# Patient Record
Sex: Male | Born: 1980 | Race: Black or African American | Hispanic: No | Smoking: Current every day smoker
Health system: Southern US, Community
[De-identification: ages and names within clinical notes are randomized; demographics above are authoritative.]

## PROBLEM LIST (undated history)

## (undated) DIAGNOSIS — R011 Cardiac murmur, unspecified: Secondary | ICD-10-CM

---

## 2010-03-14 ENCOUNTER — Emergency Department: Payer: Self-pay | Admitting: Emergency Medicine

## 2015-03-21 ENCOUNTER — Emergency Department
Admission: EM | Admit: 2015-03-21 | Discharge: 2015-03-21 | Disposition: A | Discharge status: Home / Self Care | Payer: Self-pay | Attending: Emergency Medicine | Admitting: Emergency Medicine

## 2015-03-21 ENCOUNTER — Other Ambulatory Visit: Payer: Self-pay | Admitting: Emergency Medicine

## 2015-03-21 ENCOUNTER — Emergency Department: Payer: Self-pay

## 2015-03-21 DIAGNOSIS — F1410 Cocaine abuse, uncomplicated: Secondary | ICD-10-CM | POA: Insufficient documentation

## 2015-03-21 DIAGNOSIS — F10120 Alcohol abuse with intoxication, uncomplicated: Secondary | ICD-10-CM | POA: Insufficient documentation

## 2015-03-21 DIAGNOSIS — Z720 Tobacco use: Secondary | ICD-10-CM | POA: Insufficient documentation

## 2015-03-21 DIAGNOSIS — F10920 Alcohol use, unspecified with intoxication, uncomplicated: Secondary | ICD-10-CM

## 2015-03-21 DIAGNOSIS — R0789 Other chest pain: Secondary | ICD-10-CM | POA: Insufficient documentation

## 2015-03-21 DIAGNOSIS — F1910 Other psychoactive substance abuse, uncomplicated: Secondary | ICD-10-CM

## 2015-03-21 DIAGNOSIS — K922 Gastrointestinal hemorrhage, unspecified: Secondary | ICD-10-CM | POA: Insufficient documentation

## 2015-03-21 LAB — CBC WITH DIFFERENTIAL/PLATELET
Basophils Absolute: 0 10*3/uL (ref 0–0.1)
Basophils Absolute: 0 10*3/uL (ref 0–0.1)
Basophils Relative: 0 %
Basophils Relative: 0 %
EOS PCT: 1 %
EOS PCT: 1 %
Eosinophils Absolute: 0.1 10*3/uL (ref 0–0.7)
Eosinophils Absolute: 0.1 10*3/uL (ref 0–0.7)
HCT: 44.9 % (ref 40.0–52.0)
HEMATOCRIT: 40.3 % (ref 40.0–52.0)
HEMOGLOBIN: 14.9 g/dL (ref 13.0–18.0)
Hemoglobin: 13.3 g/dL (ref 13.0–18.0)
LYMPHS ABS: 1.8 10*3/uL (ref 1.0–3.6)
LYMPHS ABS: 1.9 10*3/uL (ref 1.0–3.6)
LYMPHS PCT: 25 %
LYMPHS PCT: 33 %
MCH: 30.8 pg (ref 26.0–34.0)
MCH: 30.6 pg (ref 26.0–34.0)
MCHC: 33.1 g/dL (ref 32.0–36.0)
MCHC: 33 g/dL (ref 32.0–36.0)
MCV: 93 fL (ref 80.0–100.0)
MCV: 93 fL (ref 80.0–100.0)
MONO ABS: 0.4 10*3/uL (ref 0.2–1.0)
Monocytes Absolute: 0.4 10*3/uL (ref 0.2–1.0)
Monocytes Relative: 6 %
Monocytes Relative: 7 %
NEUTROS ABS: 3.4 10*3/uL (ref 1.4–6.5)
Neutro Abs: 4.8 10*3/uL (ref 1.4–6.5)
Neutrophils Relative %: 68 %
Neutrophils Relative %: 59 %
PLATELETS: 189 10*3/uL (ref 150–440)
PLATELETS: 173 10*3/uL (ref 150–440)
RBC: 4.83 MIL/uL (ref 4.40–5.90)
RBC: 4.34 MIL/uL — AB (ref 4.40–5.90)
RDW: 13.3 % (ref 11.5–14.5)
RDW: 13.1 % (ref 11.5–14.5)
WBC: 7.1 10*3/uL (ref 3.8–10.6)
WBC: 5.9 10*3/uL (ref 3.8–10.6)

## 2015-03-21 LAB — URINE DRUG SCREEN, QUALITATIVE (ARMC ONLY)
AMPHETAMINES, UR SCREEN: NOT DETECTED
Barbiturates, Ur Screen: NOT DETECTED
Benzodiazepine, Ur Scrn: NOT DETECTED
Cannabinoid 50 Ng, Ur ~~LOC~~: NOT DETECTED
Cocaine Metabolite,Ur ~~LOC~~: POSITIVE — AB
MDMA (ECSTASY) UR SCREEN: NOT DETECTED
Methadone Scn, Ur: NOT DETECTED
OPIATE, UR SCREEN: NOT DETECTED
PHENCYCLIDINE (PCP) UR S: NOT DETECTED
Tricyclic, Ur Screen: NOT DETECTED

## 2015-03-21 LAB — COMPREHENSIVE METABOLIC PANEL
ALK PHOS: 66 U/L (ref 38–126)
ALT: 62 U/L (ref 17–63)
AST: 66 U/L — ABNORMAL HIGH (ref 15–41)
Albumin: 4.9 g/dL (ref 3.5–5.0)
Anion gap: 9 (ref 5–15)
BILIRUBIN TOTAL: 0.7 mg/dL (ref 0.3–1.2)
BUN: 9 mg/dL (ref 6–20)
CALCIUM: 8.8 mg/dL — AB (ref 8.9–10.3)
CO2: 26 mmol/L (ref 22–32)
CREATININE: 0.7 mg/dL (ref 0.61–1.24)
Chloride: 100 mmol/L — ABNORMAL LOW (ref 101–111)
Glucose, Bld: 96 mg/dL (ref 65–99)
Potassium: 3.3 mmol/L — ABNORMAL LOW (ref 3.5–5.1)
Sodium: 135 mmol/L (ref 135–145)
TOTAL PROTEIN: 8.2 g/dL — AB (ref 6.5–8.1)

## 2015-03-21 LAB — LIPASE, BLOOD: LIPASE: 36 U/L (ref 22–51)

## 2015-03-21 LAB — TYPE AND SCREEN
ABO/RH(D): O POS
Antibody Screen: NEGATIVE

## 2015-03-21 LAB — TROPONIN I
Troponin I: <0.03 ng/mL (ref <0.031)
Troponin I: <0.03 ng/mL (ref <0.031)

## 2015-03-21 LAB — ETHANOL: Alcohol, Ethyl (B): 289 mg/dL — ABNORMAL HIGH (ref <5)

## 2015-03-21 MED ORDER — SODIUM CHLORIDE 0.9 % IV BOLUS (SEPSIS)
1000.0000 mL | Freq: Once | INTRAVENOUS | Status: AC
  Administered 2015-03-21: 1000 mL via INTRAVENOUS

## 2015-03-21 MED ORDER — HYDROCORTISONE 1 % EX CREA: 1.0000 "application " | TOPICAL_CREAM | Freq: Two times a day (BID) | CUTANEOUS | Status: DC

## 2015-03-21 MED ORDER — LORAZEPAM 2 MG/ML IJ SOLN
1.0000 mg | Freq: Once | INTRAMUSCULAR | Status: AC
  Administered 2015-03-21: 1 mg via INTRAVENOUS
  Filled 2015-03-21: qty 1

## 2015-03-21 MED ORDER — PSYLLIUM 28 % PO PACK: 1.0000 | PACK | Freq: Two times a day (BID) | ORAL | Status: AC | PRN

## 2015-03-21 NOTE — ED Notes (Signed)
Family at bedside. 

## 2015-03-21 NOTE — Discharge Instructions (Signed)
Alcohol Intoxication Alcohol intoxication occurs when the amount of alcohol that a person has consumed impairs his or her ability to mentally and physically function. Alcohol directly impairs the normal chemical activity of the brain. Drinking large amounts of alcohol can lead to changes in mental function and behavior, and it can cause many physical effects that can be harmful.  Alcohol intoxication can range in severity from mild to very severe. Various factors can affect the level of intoxication that occurs, such as the person's age, gender, weight, frequency of alcohol consumption, and the presence of other medical conditions (such as diabetes, seizures, or heart conditions). Dangerous levels of alcohol intoxication may occur when people drink large amounts of alcohol in a short period (binge drinking). Alcohol can also be especially dangerous when combined with certain prescription medicines or "recreational" drugs. SIGNS AND SYMPTOMS Some common signs and symptoms of mild alcohol intoxication include:  Loss of coordination.  Changes in mood and behavior.  Impaired judgment.  Slurred speech. As alcohol intoxication progresses to more severe levels, other signs and symptoms will appear. These may include:  Vomiting.  Confusion and impaired memory.  Slowed breathing.  Seizures.  Loss of consciousness. DIAGNOSIS  Your health care provider will take a medical history and perform a physical exam. You will be asked about the amount and type of alcohol you have consumed. Blood tests will be done to measure the concentration of alcohol in your blood. In many places, your blood alcohol level must be lower than 80 mg/dL (1.61%) to legally drive. However, many dangerous effects of alcohol can occur at much lower levels.  TREATMENT  People with alcohol intoxication often do not require treatment. Most of the effects of alcohol intoxication are temporary, and they go away as the alcohol naturally  leaves the body. Your health care provider will monitor your condition until you are stable enough to go home. Fluids are sometimes given through an IV access tube to help prevent dehydration.  HOME CARE INSTRUCTIONS  Do not drive after drinking alcohol.  Stay hydrated. Drink enough water and fluids to keep your urine clear or pale yellow. Avoid caffeine.   Only take over-the-counter or prescription medicines as directed by your health care provider.  SEEK MEDICAL CARE IF:   You have persistent vomiting.   You do not feel better after a few days.  You have frequent alcohol intoxication. Your health care provider can help determine if you should see a substance use treatment counselor. SEEK IMMEDIATE MEDICAL CARE IF:   You become shaky or tremble when you try to stop drinking.   You shake uncontrollably (seizure).   You throw up (vomit) blood. This may be bright red or may look like black coffee grounds.   You have blood in your stool. This may be bright red or may appear as a black, tarry, bad smelling stool.   You become lightheaded or faint.  MAKE SURE YOU:   Understand these instructions.  Will watch your condition.  Will get help right away if you are not doing well or get worse.   This information is not intended to replace advice given to you by your health care provider. Make sure you discuss any questions you have with your health care provider.   Document Released: 03/10/2005 Document Revised: 01/31/2013 Document Reviewed: 11/03/2012 Elsevier Interactive Patient Education 2016 Else[employer]  Gastrointestinal Bleeding Gastrointestinal bleeding is bleeding somewhere along the path that food travels through the body (digestive tract). This path is  anywhere between the mouth and the opening of the butt (anus). You may have blood in your throw up (vomit) or in your poop (stools). If there is a lot of bleeding, you may need to stay in the hospital. HOME  CARE  Only take medicine as told by your doctor.  Eat foods with fiber such as whole grains, fruits, and vegetables. You can also try eating 1 to 3 prunes a day.  Drink enough fluids to keep your pee (urine) clear or pale yellow. GET HELP RIGHT AWAY IF:   Your bleeding gets worse.  You feel dizzy, weak, or you pass out (faint).  You have bad cramps in your back or belly (abdomen).  You have large blood clumps (clots) in your poop.  Your problems are getting worse. MAKE SURE YOU:   Understand these instructions.  Will watch your condition.  Will get help right away if you are not doing well or get worse.   This information is not intended to replace advice given to you by your health care provider. Make sure you discuss any questions you have with your health care provider.   Document Released: 03/09/2008 Document Revised: 05/17/2012 Document Reviewed: 11/18/2014 Elsevier Interactive Patient Education 2016 [employer].  Polysubstance Abuse When people abuse more than one drug or type of drug it is called polysubstance or polydrug abuse. For example, many smokers also drink alcohol. This is one form of polydrug abuse. Polydrug abuse also refers to the use of a drug to counteract an unpleasant effect produced by another drug. It may also be used to help with withdrawal from another drug. People who take stimulants may become agitated. Sometimes this agitation is countered with a tranquilizer. This helps protect against the unpleasant side effects. Polydrug abuse also refers to the use of different drugs at the same time.  Anytime drug use is interfering with normal living activities, it has become abuse. This includes problems with family and friends. Psychological dependence has developed when your mind tells you that the drug is needed. This is usually followed by physical dependence which has developed when continuing increases of drug are required to get the same feeling or  "high". This is known as addiction or chemical dependency. A person's risk is much higher if there is a history of chemical dependency in the family. SIGNS OF CHEMICAL DEPENDENCY  You have been told by friends or family that drugs have become a problem.  You fight when using drugs.  You are having blackouts (not remembering what you do while using).  You feel sick from using drugs but continue using.  You lie about use or amounts of drugs (chemicals) used.  You need chemicals to get you going.  You are suffering in work performance or in school because of drug use.  You get sick from use of drugs but continue to use anyway.  You need drugs to relate to people or feel comfortable in social situations.  You use drugs to forget problems. "Yes" answered to any of the above signs of chemical dependency indicates there are problems. The longer the use of drugs continues, the greater the problems will become. If there is a family history of drug or alcohol use, it is best not to experiment with these drugs. Continual use leads to tolerance. After tolerance develops more of the drug is needed to get the same feeling. This is followed by addiction. With addiction, drugs become the most important part of life. It becomes more  important to take drugs than participate in the other usual activities of life. This includes relating to friends and family. Addiction is followed by dependency. Dependency is a condition where drugs are now needed not just to get high, but to feel normal. Addiction cannot be cured but it can be stopped. This often requires outside help and the care of professionals. Treatment centers are listed in the yellow pages under: Cocaine, Narcotics, and Alcoholics Anonymous. Most hospitals and clinics can refer you to a specialized care center. Talk to your caregiver if you need help.   This information is not intended to replace advice given to you by your health care provider. Make  sure you discuss any questions you have with your health care provider.   Document Released: 01/20/2005 Document Revised: 08/23/2011 Document Reviewed: 06/05/2014 Elsevier Interactive Patient Education [employer]

## 2015-03-21 NOTE — ED Provider Notes (Signed)
  Physical Exam  BP 105/69 mmHg  Pulse 84  Temp(Src) 97.8 F (36.6 C) (Oral)  Resp 22  Ht  (1.702 m)  Wt 155 lb (70.308 kg)  BMI 24.27 kg/m2  SpO2 97%  Physical Exam  ED Course  Procedures  MDM Care assumed at sign out at 7 am. Patient has cocaine and alcohol abuse here with chest tingling, blood in stool. Was thought to have hemorrhoid bleeding also intoxicated with ETOH 289. Sign out pending reassessment. Patient now more awake and able to converse briefly. Vitals stable. Second trop neg. CBC stable (give 1L NS so Hg dropped a point as expected). Prescribed anusol by previous provider. No further rectal bleeding. Went home with family.   Richardean Canal, MD 03/21/15 [phone]

## 2015-03-21 NOTE — ED Notes (Addendum)
Pt presents to ED via [facility] with c/o BRBPR for >1 month. Pt also reports some numbness and tingling in the left side of his chest, into his left arm and hand. Pt denies any N/V or shortness of breath. Pt reports ETOH, marijuana, and cocaine use frequently, as well as immediately PTA. Pt reports loose stools every time he eats, which is accompanied by the noted BRBPR. Pt is A&O, in NAD, somewhat anxious, with respirations even, regular and unlabored. Pt denies any pain, whatsoever.

## 2015-03-21 NOTE — ED Provider Notes (Signed)
[facility] Emergency Department Provider Note  ____________________________________________  Time seen: Seen upon arrival to the emergency department  I have reviewed the triage vital signs and the nursing notes.   HISTORY  Chief Complaint Hematochezia    HPI Samuel Stuart is a 34 y.o. male with a history of cocaine and alcohol abuse who is presenting today with 1 month of rectal bleeding. He says that his bleeding is bright red and is associated with stooling. He says that he has had increasing amounts of blood over the past 1 month. He says that he amount of bright red stool about an hour prior to arrival. He says that his stools have also been loose and that he has not had to push hard to move his bowels. He denies any antibiotics, travel or recent hospitalization. Said that he also snorted cocaine about 3 hours ago. Is now complaining of intermittent chest tingling to the left chest which radiates to his left arm. Denies any pain, shortness of breath, nausea or vomiting. Also denies any diaphoresis.   History reviewed. No pertinent past medical history.  There are no active problems to display for this patient.   History reviewed. No pertinent past surgical history.  No current outpatient prescriptions on file.  Allergies Review of patient's allergies indicates no known allergies.  History reviewed. No pertinent family history.  Social History Social History  Substance Use Topics  . Smoking status: Current Every Day Smoker  . Smokeless tobacco: None  . Alcohol Use: Yes    Review of Systems Constitutional: No fever/chills Eyes: No visual changes. ENT: No sore throat. Cardiovascular: "Chest tingling" as above. Respiratory: Denies shortness of breath. Gastrointestinal: No abdominal pain.  No nausea, no vomiting.  No constipation. Genitourinary: Negative for dysuria. Musculoskeletal: Negative for back pain. Skin: Negative for  rash. Neurological: Negative for headaches, focal weakness or numbness.  10-point ROS otherwise negative.  ____________________________________________   PHYSICAL EXAM:  VITAL SIGNS: ED Triage Vitals  Enc Vitals Group     BP 03/21/15 0445 136/89 mmHg     Pulse Rate 03/21/15 0445 94     Resp 03/21/15 0445 18     Temp 03/21/15 0445 97.8 F (36.6 C)     Temp Source 03/21/15 0445 Oral     SpO2 03/21/15 0445 98 %     Weight 03/21/15 0445 155 lb (70.308 kg)     Height 03/21/15 0445  (1.702 m)     Head Cir --      Peak Flow --      Pain Score 03/21/15 0451 0     Pain Loc --      Pain Edu? --      Excl. in GC? --     Constitutional: Alert and oriented. Well appearing and in no acute distress. Eyes: Conjunctivae are normal. PERRL. EOMI. Head: Atraumatic. Nose: No congestion/rhinnorhea. Mouth/Throat: Mucous membranes are moist.  Oropharynx non-erythematous. Neck: No stridor.   Cardiovascular: Normal rate, regular rhythm. Grossly normal heart sounds.  Good peripheral circulation. No reproducible tenderness palpation to the chest. Respiratory: Normal respiratory effort.  No retractions. Lungs CTAB. Gastrointestinal: Soft and nontender. No distention. No abdominal bruits. No CVA tenderness. Patient refused digital rectal exam but did allow me to examine externally and has a non-engorged hemorrhoid at 5:00. Hemorrhoid is not tender to palpation. No fissure visualized. Musculoskeletal: No lower extremity tenderness nor edema.  No joint effusions. Neurologic:  Normal speech and language. No gross focal neurologic deficits are  appreciated. No gait instability. Skin:  Skin is warm, dry and intact. No rash noted. Psychiatric: Mood and affect are normal. Speech and behavior are normal.  ____________________________________________   LABS (all labs ordered are listed, but only abnormal results are displayed)  Labs Reviewed  COMPREHENSIVE METABOLIC PANEL - Abnormal; Notable for the  following:    Potassium 3.3 (*)    Chloride 100 (*)    Calcium 8.8 (*)    Total Protein 8.2 (*)    AST 66 (*)    All other components within normal limits  ETHANOL - Abnormal; Notable for the following:    Alcohol, Ethyl (B) 289 (*)    All other components within normal limits  CBC WITH DIFFERENTIAL/PLATELET  LIPASE, BLOOD  TROPONIN I  TYPE AND SCREEN   ____________________________________________  EKG  ED ECG REPORT I, Arelia Longest, the attending physician, personally viewed and interpreted this ECG.   Date: 03/21/2015  EKG Time: 505  Rate: 80  Rhythm: normal sinus rhythm  Axis: Normal axis  Intervals:none  ST&T Change: No ST elevation or depression. No abnormal T-wave inversion.  ____________________________________________  RADIOLOGY  Chest x-ray without any acute cardiopulmonary process. I personally reviewed these films. ____________________________________________   PROCEDURES   ____________________________________________   INITIAL IMPRESSION / ASSESSMENT AND PLAN / ED COURSE  Pertinent labs & imaging results that were available during my care of the patient were reviewed by me and considered in my medical decision making (see chart for details).  ----------------------------------------- 6:55 AM on 03/21/2015 -----------------------------------------  Patient now sedate. Protecting airway and talking but visibly intoxicated. Alcohol level checked and in the 280s. Has not had any bowel movements since being in the emergency department. Highly unlikely that his rectal bleeding is due to any briskly bleeding source. I do suspect it is from the hemorrhoid visualized on the external exam. His labs are also reassuring that he is not losing a large amount of blood to cause him any sort of hemodynamic instability. The patient will need to be reassessed for sobriety. Signed out to Dr. Silverio Lay   ____________________________________________   FINAL CLINICAL  IMPRESSION(S) / ED DIAGNOSES  Acute GI bleed. Acute polysubstance abuse. Acute alcohol intoxication. Acute cocaine chest pain.    Myrna Blazer, MD 03/21/15 [phone]

## 2015-05-15 ENCOUNTER — Emergency Department
Admission: EM | Admit: 2015-05-15 | Discharge: 2015-05-15 | Disposition: A | Discharge status: Home / Self Care | Payer: Self-pay | Attending: Emergency Medicine | Admitting: Emergency Medicine

## 2015-05-15 ENCOUNTER — Encounter: Payer: Self-pay | Admitting: Emergency Medicine

## 2015-05-15 ENCOUNTER — Emergency Department: Payer: Self-pay

## 2015-05-15 DIAGNOSIS — Y9389 Activity, other specified: Secondary | ICD-10-CM | POA: Insufficient documentation

## 2015-05-15 DIAGNOSIS — Z7952 Long term (current) use of systemic steroids: Secondary | ICD-10-CM | POA: Insufficient documentation

## 2015-05-15 DIAGNOSIS — S0990XA Unspecified injury of head, initial encounter: Secondary | ICD-10-CM

## 2015-05-15 DIAGNOSIS — F10129 Alcohol abuse with intoxication, unspecified: Secondary | ICD-10-CM | POA: Insufficient documentation

## 2015-05-15 DIAGNOSIS — S0001XA Abrasion of scalp, initial encounter: Secondary | ICD-10-CM | POA: Insufficient documentation

## 2015-05-15 DIAGNOSIS — F17210 Nicotine dependence, cigarettes, uncomplicated: Secondary | ICD-10-CM | POA: Insufficient documentation

## 2015-05-15 DIAGNOSIS — Y998 Other external cause status: Secondary | ICD-10-CM | POA: Insufficient documentation

## 2015-05-15 DIAGNOSIS — Y9289 Other specified places as the place of occurrence of the external cause: Secondary | ICD-10-CM | POA: Insufficient documentation

## 2015-05-15 DIAGNOSIS — S0003XA Contusion of scalp, initial encounter: Secondary | ICD-10-CM | POA: Insufficient documentation

## 2015-05-15 DIAGNOSIS — W228XXA Striking against or struck by other objects, initial encounter: Secondary | ICD-10-CM | POA: Insufficient documentation

## 2015-05-15 HISTORY — DX: Cardiac murmur, unspecified: R01.1

## 2015-05-15 NOTE — ED Notes (Addendum)
Pt in police custody, [employer] at bedside
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
## 2015-05-15 NOTE — ED Notes (Signed)
Pt given ice pack upon discharge. 
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
## 2015-05-15 NOTE — Discharge Instructions (Signed)
Head Injury, Adult °You have a head injury. Headaches and throwing up (vomiting) are common after a head injury. It should be easy to wake up from sleeping. Sometimes you must stay in the hospital. Most problems happen within the first 24 hours. Side effects may occur up to 7-10 days after the injury.  °WHAT ARE THE TYPES OF HEAD INJURIES? °Head injuries can be as minor as a bump. Some head injuries can be more severe. More severe head injuries include: °· A jarring injury to the brain (concussion). °· A bruise of the brain (contusion). This mean there is bleeding in the brain that can cause swelling. °· A cracked skull (skull fracture). °· Bleeding in the brain that collects, clots, and forms a bump (hematoma). °WHEN SHOULD I GET HELP RIGHT AWAY?  °· You are confused or sleepy. °· You cannot be woken up. °· You feel sick to your stomach (nauseous) or keep throwing up (vomiting). °· Your dizziness or unsteadiness is getting worse. °· You have very bad, lasting headaches that are not helped by medicine. Take medicines only as told by your doctor. °· You cannot use your arms or legs like normal. °· You cannot walk. °· You notice changes in the black spots in the center of the colored part of your eye (pupil). °· You have clear or bloody fluid coming from your nose or ears. °· You have trouble seeing. °During the next 24 hours after the injury, you must stay with someone who can watch you. This person should get help right away (call 911 in the [place] if you start to shake and are not able to control it (have seizures), you pass out, or you are unable to wake up. °HOW CAN I PREVENT A HEAD INJURY IN THE FUTURE? °· Wear seat belts. °· Wear a helmet while bike riding and playing sports like football. °· Stay away from dangerous activities around the house. °WHEN CAN I RETURN TO NORMAL ACTIVITIES AND ATHLETICS? °See your doctor before doing these activities. You should not do normal activities or play contact sports until 1  week after the following symptoms have stopped: °· Headache that does not go away. °· Dizziness. °· Poor attention. °· Confusion. °· Memory problems. °· Sickness to your stomach or throwing up. °· Tiredness. °· Fussiness. °· Bothered by bright lights or loud noises. °· Anxiousness or depression. °· Restless sleep. °MAKE SURE YOU:  °· Understand these instructions. °· Will watch your condition. °· Will get help right away if you are not doing well or get worse. °  °This information is not intended to replace advice given to you by your health care provider. Make sure you discuss any questions you have with your health care provider. °  °Document Released: 05/13/2008 Document Revised: 06/21/2014 Document Reviewed: 02/05/2013 °Elsevier Interactive Patient Education ©2016 [employer] ° °

## 2015-11-04 ENCOUNTER — Emergency Department: Payer: Self-pay

## 2015-11-04 ENCOUNTER — Emergency Department
Admission: EM | Admit: 2015-11-04 | Discharge: 2015-11-04 | Disposition: A | Discharge status: Home / Self Care | Payer: Self-pay | Attending: Emergency Medicine | Admitting: Emergency Medicine

## 2015-11-04 ENCOUNTER — Encounter: Payer: Self-pay | Admitting: Emergency Medicine

## 2015-11-04 DIAGNOSIS — S0990XA Unspecified injury of head, initial encounter: Secondary | ICD-10-CM

## 2015-11-04 DIAGNOSIS — F1290 Cannabis use, unspecified, uncomplicated: Secondary | ICD-10-CM | POA: Insufficient documentation

## 2015-11-04 DIAGNOSIS — F17210 Nicotine dependence, cigarettes, uncomplicated: Secondary | ICD-10-CM | POA: Insufficient documentation

## 2015-11-04 DIAGNOSIS — S0101XA Laceration without foreign body of scalp, initial encounter: Secondary | ICD-10-CM | POA: Insufficient documentation

## 2015-11-04 DIAGNOSIS — Y939 Activity, unspecified: Secondary | ICD-10-CM | POA: Insufficient documentation

## 2015-11-04 DIAGNOSIS — Y92009 Unspecified place in unspecified non-institutional (private) residence as the place of occurrence of the external cause: Secondary | ICD-10-CM | POA: Insufficient documentation

## 2015-11-04 DIAGNOSIS — Y999 Unspecified external cause status: Secondary | ICD-10-CM | POA: Insufficient documentation

## 2015-11-04 DIAGNOSIS — W19XXXA Unspecified fall, initial encounter: Secondary | ICD-10-CM | POA: Insufficient documentation

## 2015-11-04 MED ORDER — AMOXICILLIN-POT CLAVULANATE 875-125 MG PO TABS: 1.0000 | ORAL_TABLET | Freq: Two times a day (BID) | ORAL | Status: AC

## 2015-11-04 NOTE — Discharge Instructions (Signed)
Nonsutured Laceration Care °A laceration is a cut that goes through all layers of the skin and extends into the tissue that is right under the skin. This type of cut is usually stitched up (sutured) or closed with tape (adhesive strips) or skin glue shortly after the injury happens. °However, if the wound is dirty or if several hours pass before medical treatment is provided, it is likely that germs (bacteria) will enter the wound. Closing a laceration after bacteria have entered it increases the risk of infection. In these cases, your health care provider may leave the laceration open (nonsutured) and cover it with a bandage. This type of treatment helps prevent infection and allows the wound to heal from the deepest layer of tissue damage up to the surface. °An open fracture is a type of injury that may involve nonsutured lacerations. An open fracture is a break in a bone that happens along with one or more lacerations through the skin that is near the fracture site. °HOW TO CARE FOR YOUR NONSUTURED LACERATION °· Take or apply over-the-counter and prescription medicines only as told by your health care provider. °· If you were prescribed an antibiotic medicine, take or apply it as told by your health care provider. Do not stop using the antibiotic even if your condition improves. °· Clean the wound one time each day or as told by your health care provider. °¨ Wash the wound with mild soap and water. °¨ Rinse the wound with water to remove all soap. °¨ Pat your wound dry with a clean towel. Do not rub the wound. °· Do not inject anything into the wound unless your health care provider told you to. °· Change any bandages (dressings) as told by your health care provider. This includes changing the dressing if it gets wet, dirty, or starts to smell bad. °· Keep the dressing dry until your health care provider says it can be removed. Do not take baths, swim, or do anything that puts your wound underwater until your  health care provider approves. °· Raise (elevate) the injured area above the level of your heart while you are sitting or lying down, if possible. °· Do not scratch or pick at the wound. °· Check your wound every day for signs of infection. Watch for: °¨ Redness, swelling, or pain. °¨ Fluid, blood, or pus. °· Keep all follow-up visits as told by your health care provider. This is important. °SEEK MEDICAL CARE IF: °· You received a tetanus and shot and you have swelling, severe pain, redness, or bleeding at the injection site.   °· You have a fever. °· Your pain is not controlled with medicine. °· You have increased redness, swelling, or pain at the site of your wound. °· You have fluid, blood, or pus coming from your wound. °· You notice a bad smell coming from your wound or your dressing. °· You notice something coming out of the wound, such as wood or glass. °· You notice a change in the color of your skin near your wound. °· You develop a new rash. °· You need to change the dressing frequently due to fluid, blood, or pus draining from the wound. °· You develop numbness around your wound. °SEEK IMMEDIATE MEDICAL CARE IF: °· Your pain suddenly increases and is severe. °· You develop severe swelling around the wound. °· The wound is on your hand or foot and you cannot properly move a finger or toe. °· The wound is on your hand or   foot and you notice that your fingers or toes look pale or bluish. °· You have a red streak going away from your wound. °  °This information is not intended to replace advice given to you by your health care provider. Make sure you discuss any questions you have with your health care provider. °  °Document Released: 04/28/2006 Document Revised: 10/15/2014 Document Reviewed: 05/27/2014 °Elsevier Interactive Patient Education ©2016 [employer] ° °
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
## 2015-11-04 NOTE — ED Notes (Signed)
Pt discharged home after verbalizing understanding of discharge instructions; nad noted. 
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
## 2016-10-11 ENCOUNTER — Emergency Department
Admission: EM | Admit: 2016-10-11 | Discharge: 2016-10-11 | Disposition: A | Discharge status: Home / Self Care | Payer: Self-pay

## 2016-10-11 NOTE — ED Triage Notes (Signed)
Animal bite to finger a few days ago. States "I just want to make sure it's not getting infected". Patient states that he does not know dog that bit him.

## 2016-10-12 ENCOUNTER — Telehealth: Payer: Self-pay | Admitting: Emergency Medicine

## 2016-10-12 NOTE — Telephone Encounter (Signed)
Called patient due to lwot to inquire about condition and follow up plans.  Number is not in service.  
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
## 2018-08-27 ENCOUNTER — Other Ambulatory Visit: Payer: Self-pay

## 2018-08-27 ENCOUNTER — Emergency Department
Admission: EM | Admit: 2018-08-27 | Discharge: 2018-08-27 | Disposition: A | Discharge status: Home / Self Care | Payer: Self-pay | Attending: Emergency Medicine | Admitting: Emergency Medicine

## 2018-08-27 ENCOUNTER — Encounter: Payer: Self-pay | Admitting: Emergency Medicine

## 2018-08-27 DIAGNOSIS — F17210 Nicotine dependence, cigarettes, uncomplicated: Secondary | ICD-10-CM | POA: Insufficient documentation

## 2018-08-27 DIAGNOSIS — Y908 Blood alcohol level of 240 mg/100 ml or more: Secondary | ICD-10-CM | POA: Insufficient documentation

## 2018-08-27 DIAGNOSIS — F1010 Alcohol abuse, uncomplicated: Secondary | ICD-10-CM | POA: Insufficient documentation

## 2018-08-27 LAB — URINE DRUG SCREEN, QUALITATIVE (ARMC ONLY)
Amphetamines, Ur Screen: NOT DETECTED
BENZODIAZEPINE, UR SCRN: NOT DETECTED
Barbiturates, Ur Screen: NOT DETECTED
Cannabinoid 50 Ng, Ur ~~LOC~~: POSITIVE — AB
Cocaine Metabolite,Ur ~~LOC~~: NOT DETECTED
MDMA (Ecstasy)Ur Screen: NOT DETECTED
Methadone Scn, Ur: NOT DETECTED
OPIATE, UR SCREEN: NOT DETECTED
Phencyclidine (PCP) Ur S: NOT DETECTED
Tricyclic, Ur Screen: NOT DETECTED

## 2018-08-27 LAB — COMPREHENSIVE METABOLIC PANEL
ALT: 34 U/L (ref 0–44)
AST: 51 U/L — ABNORMAL HIGH (ref 15–41)
Albumin: 4.7 g/dL (ref 3.5–5.0)
Alkaline Phosphatase: 74 U/L (ref 38–126)
Anion gap: 11 (ref 5–15)
BUN: 11 mg/dL (ref 6–20)
CO2: 25 mmol/L (ref 22–32)
Calcium: 8.8 mg/dL — ABNORMAL LOW (ref 8.9–10.3)
Chloride: 103 mmol/L (ref 98–111)
Creatinine, Ser: 0.73 mg/dL (ref 0.61–1.24)
GFR calc Af Amer: >60 mL/min (ref >60)
GFR calc non Af Amer: >60 mL/min (ref >60)
GLUCOSE: 102 mg/dL — AB (ref 70–99)
Potassium: 4 mmol/L (ref 3.5–5.1)
Sodium: 139 mmol/L (ref 135–145)
Total Bilirubin: 0.5 mg/dL (ref 0.3–1.2)
Total Protein: 8 g/dL (ref 6.5–8.1)

## 2018-08-27 LAB — ACETAMINOPHEN LEVEL: Acetaminophen (Tylenol), Serum: <10 ug/mL — ABNORMAL LOW (ref 10–30)

## 2018-08-27 LAB — CBC
HEMATOCRIT: 45.5 % (ref 39.0–52.0)
Hemoglobin: 15.4 g/dL (ref 13.0–17.0)
MCH: 30.5 pg (ref 26.0–34.0)
MCHC: 33.8 g/dL (ref 30.0–36.0)
MCV: 90.1 fL (ref 80.0–100.0)
Platelets: 239 10*3/uL (ref 150–400)
RBC: 5.05 MIL/uL (ref 4.22–5.81)
RDW: 13.9 % (ref 11.5–15.5)
WBC: 6.2 10*3/uL (ref 4.0–10.5)
nRBC: 0 % (ref 0.0–0.2)

## 2018-08-27 LAB — ETHANOL: ALCOHOL ETHYL (B): 285 mg/dL — AB (ref <10)

## 2018-08-27 LAB — SALICYLATE LEVEL: Salicylate Lvl: <7 mg/dL (ref 2.8–30.0)

## 2018-08-27 LAB — FOLATE: Folate: 7.7 ng/mL (ref >5.9)

## 2018-08-27 MED ORDER — VITAMIN B-1 100 MG PO TABS: 100.0000 mg | ORAL_TABLET | Freq: Once | ORAL | Status: DC

## 2018-08-27 NOTE — ED Notes (Signed)
SOC called report given, SOC machine moved into patients room.  Pt. Woke and ready.

## 2018-08-27 NOTE — Discharge Instructions (Addendum)

## 2018-08-27 NOTE — ED Notes (Signed)
Explained to pt the IVC process and drawing blood/getting urine specimen; pt has been calm and cooperative until right before drawing blood; pt became agitated, said "you ain't drawing no blood"; very defensive; pt to bathroom to give urine specimen but unable to draw blood in triage

## 2018-08-27 NOTE — ED Notes (Signed)
Patient resting quietly in room. No noted distress or abnormal behaviors noted. Will continue 15 minute checks and observation by security camera for safety. 

## 2018-08-27 NOTE — ED Notes (Signed)
Gave pt tooth brush, deodorant, and washed up. Pt is waiting on ride to pick him up.

## 2018-08-27 NOTE — ED Provider Notes (Signed)
[facility] Emergency Department Provider Note   ____________________________________________   First MD Initiated Contact with Patient 08/27/18 0215     (approximate)  I have reviewed the triage vital signs and the nursing notes.   HISTORY  Chief Complaint Mental Health Problem    HPI Samuel Stuart is a 38 y.o. male here for evaluation of "involuntary commitment"  Patient reports that he and his girlfriend were both drinking, he reports she was drinking pretty heavily tonight and called the police by his girlfriend when he might of said some about wanting to hurt himself but reports that he said it because he was drunk.  He reports that they (police) came in he came with them because he knew that he had to.  He reports he denies that he has any thoughts of hurting himself now and made a dumb statement.  He reports he has a steady job for 2 years, works 10-hour shifts daily, drinks beer in the evening each night.  Denies going into withdrawals, but does report that sometimes at the end of his shift she will start to feel a little bit shaky in his hands and gets better after he drinks.  He drinks 2, 40 ounce beers most nights,  same this evening.  Denies overdosing on anything including medications or bleach.  Denies any hallucinations.  Denies desire to harm himself or else.  He denies making any statements or demonstrate any was drinking "bleach.  Reports he would have done anything like this.  Past Medical History:  Diagnosis Date   Heart murmur     There are no active problems to display for this patient.   History reviewed. No pertinent surgical history.  Prior to Admission medications   Not on File    Allergies Patient has no known allergies.  History reviewed. No pertinent family history.  Social History Social History   Tobacco Use   Smoking status: Current Every Day Smoker    Packs/day: 0.50    Types: Cigarettes   Smokeless  tobacco: Never Used  Substance Use Topics   Alcohol use: Yes   Drug use: Yes    Types: Marijuana    Comment: last smoked about 1 hour ago    Review of Systems Constitutional: No fever/chills reports he has been healthy working his regular 10-hour drive each day. Eyes: No visual changes. ENT: No sore throat. Cardiovascular: Denies chest pain. Respiratory: Denies shortness of breath. Gastrointestinal: No abdominal pain.   Skeletal: Sometimes gets a little shakes in the hands at the ends of his work shift Skin: Negative for rash. Neurological: Negative for headaches, areas of focal weakness or numbness.    ____________________________________________   PHYSICAL EXAM:  VITAL SIGNS: ED Triage Vitals  Enc Vitals Group     BP 08/27/18 0111 (!) 141/95     Pulse Rate 08/27/18 0111 89     Resp 08/27/18 0111 17     Temp 08/27/18 0111 98 F (36.7 C)     Temp Source 08/27/18 0111 Oral     SpO2 08/27/18 0111 99 %     Weight 08/27/18 0112 150 lb (68 kg)     Height 08/27/18 0112 5\' 7"  (1.702 m)     Head Circumference --      Peak Flow --      Pain Score 08/27/18 0112 0     Pain Loc --      Pain Edu? --      Excl. in GC? --  Constitutional: Alert and oriented. Well appearing and in no acute distress.  He is ambulatory, pleasant.  Conversant.  In no distress.  Does not apparent that he has any evidence of clinical intoxication by exam. Eyes: Conjunctivae are normal. Head: Atraumatic. Nose: No congestion/rhinnorhea. Mouth/Throat: Mucous membranes are moist. Neck: No stridor.  Cardiovascular: Normal rate, regular rhythm. Grossly normal heart sounds.  Good peripheral circulation. Respiratory: Normal respiratory effort.  No retractions. Lungs CTAB. Gastrointestinal: Soft and nontender. No distention. Musculoskeletal: No lower extremity tenderness nor edema.  He is walks with normal gait.  His speech is clear. Neurologic:  Normal speech and language. No gross focal neurologic  deficits are appreciated.  Skin:  Skin is warm, dry and intact. No rash noted. Psychiatric: Mood and affect are normal. Speech and behavior are normal.  ____________________________________________   LABS (all labs ordered are listed, but only abnormal results are displayed)  Labs Reviewed  COMPREHENSIVE METABOLIC PANEL - Abnormal; Notable for the following components:      Result Value   Glucose, Bld 102 (*)    Calcium 8.8 (*)    AST 51 (*)    All other components within normal limits  ETHANOL - Abnormal; Notable for the following components:   Alcohol, Ethyl (B) 285 (*)    All other components within normal limits  ACETAMINOPHEN LEVEL - Abnormal; Notable for the following components:   Acetaminophen (Tylenol), Serum <10 (*)    All other components within normal limits  URINE DRUG SCREEN, QUALITATIVE ([facility] ONLY) - Abnormal; Notable for the following components:   Cannabinoid 50 Ng, Ur [place] POSITIVE (*)    All other components within normal limits  SALICYLATE LEVEL  CBC  FOLATE   ____________________________________________  EKG   ____________________________________________  RADIOLOGY    ____________________________________________   PROCEDURES  Procedure(s) performed: None  Procedures  Critical Care performed: No  ____________________________________________   INITIAL IMPRESSION / ASSESSMENT AND PLAN / ED COURSE  Pertinent labs & imaging results that were available during my care of the patient were reviewed by me and considered in my medical decision making (see chart for details).   History does not appear to be compatible with self-injurious behavior or overdose.  He has a normal anion gap.  Reports to me he only drinks store-bought beer and 40 ounce cans and sometimes 24 ounces.  His blood alcohol does corroborate his intoxication, but clinically he is quite alert oriented without evidence of obvious intoxication likely because he is a regular heavy  drinker.  Liver pattern consistent with alcohol disease.  Patient currently denying any desire to harm himself or anyone else, would like to be seen by psychiatrist to be able to go home.  I have placed a tele-psych consult and continued his IVC which she came with until he can be evaluated by psychiatry for their recommendation.    ----------------------------------------- 4:15 AM on 08/27/2018 -----------------------------------------  Patient has been seen and cleared for discharge to home with outpatient referral by psychiatry.  Patient does not meet criteria for IVC per psychiatry and IVC has been rescinded.  The patient is currently resting comfortably, he is released from IVC, but at this point he prefers to rest before discharge and I think this is reasonable given his intoxication by blood alcohol testing.  We will await a little bit earlier in the morning and anticipate discharge once he is up and about.  ----------------------------------------- 7:23 AM on 08/27/2018 -----------------------------------------  Patient is fully alert, no distress.  Agreeable with plan  for discharge.  Ambulating in no distress.  Police planning to bring the patient back to his home.  Return precautions and treatment recommendations and follow-up discussed with the patient who is agreeable with the plan.  ____________________________________________   FINAL CLINICAL IMPRESSION(S) / ED DIAGNOSES  Final diagnoses:  Alcohol abuse        Note:  This document was prepared using Dragon voice recognition software and may include unintentional dictation errors       Sharyn Creamer, MD 08/27/18 [phone]

## 2018-08-27 NOTE — ED Triage Notes (Signed)
Pt brought to ED by [employer] officer Delford Field with IVC papers; pt says his "boo boo is worried about me"; pt calm and cooperative at this time; pt admits to drinking alcohol and says he did say he wanted to hurt himself; "but I didn't";

## 2018-08-27 NOTE — ED Notes (Signed)
After officer Retta Diones and [employer] officer Aline August talked to pt for about 20, he calmed back down, changed into scrubs and allowed this RN to draw his blood; pt back to being calm, cooperative and laughing with staff; clothing including striped blue shirt, boxers, socks, blue jeans, sneakers and gray jacket put in 2 pt belonging bags and given to Ramsay, Charity fundraiser; no cellphone or jewelry present with pt

## 2018-08-27 NOTE — ED Notes (Signed)
Pt. Came in under IVC for making SI statements to wife and [employer]. Pt. Admits to drinking last night.  Pt. Denies SI/HI.  Pt. Denies psych. Hx.

## 2018-08-27 NOTE — ED Notes (Signed)
Pt discharged to lobby (ride is on the way).  VS stable. Pt denies SI. Denies pain. All belongings returned to patient. Pt signed for discharge instructions. Discharge instructions reviewed with patient.

## 2018-08-27 NOTE — ED Notes (Signed)
Pt dressing for discharge. VS stable. Maintained on 15 minute checks and observation by security camera for safety.

## 2019-07-14 ENCOUNTER — Other Ambulatory Visit: Payer: Self-pay

## 2019-07-14 ENCOUNTER — Emergency Department
Admission: EM | Admit: 2019-07-14 | Discharge: 2019-07-14 | Disposition: A | Discharge status: Home / Self Care | Payer: Self-pay | Attending: Emergency Medicine | Admitting: Emergency Medicine

## 2019-07-14 DIAGNOSIS — F1210 Cannabis abuse, uncomplicated: Secondary | ICD-10-CM | POA: Insufficient documentation

## 2019-07-14 DIAGNOSIS — F17210 Nicotine dependence, cigarettes, uncomplicated: Secondary | ICD-10-CM | POA: Insufficient documentation

## 2019-07-14 DIAGNOSIS — K2900 Acute gastritis without bleeding: Secondary | ICD-10-CM | POA: Insufficient documentation

## 2019-07-14 DIAGNOSIS — F10920 Alcohol use, unspecified with intoxication, uncomplicated: Secondary | ICD-10-CM | POA: Insufficient documentation

## 2019-07-14 LAB — COMPREHENSIVE METABOLIC PANEL
ALT: 19 U/L (ref 0–44)
AST: 28 U/L (ref 15–41)
Albumin: 4.6 g/dL (ref 3.5–5.0)
Alkaline Phosphatase: 72 U/L (ref 38–126)
Anion gap: 14 (ref 5–15)
BUN: 10 mg/dL (ref 6–20)
CO2: 22 mmol/L (ref 22–32)
Calcium: 9.1 mg/dL (ref 8.9–10.3)
Chloride: 104 mmol/L (ref 98–111)
Creatinine, Ser: 0.91 mg/dL (ref 0.61–1.24)
GFR calc Af Amer: >60 mL/min (ref >60)
GFR calc non Af Amer: >60 mL/min (ref >60)
Glucose, Bld: 93 mg/dL (ref 70–99)
Potassium: 3.8 mmol/L (ref 3.5–5.1)
Sodium: 140 mmol/L (ref 135–145)
Total Bilirubin: 0.4 mg/dL (ref 0.3–1.2)
Total Protein: 7.9 g/dL (ref 6.5–8.1)

## 2019-07-14 LAB — CBC WITH DIFFERENTIAL/PLATELET
Abs Immature Granulocytes: 0.03 10*3/uL (ref 0.00–0.07)
Basophils Absolute: 0.1 10*3/uL (ref 0.0–0.1)
Basophils Relative: 1 %
Eosinophils Absolute: 0.2 10*3/uL (ref 0.0–0.5)
Eosinophils Relative: 2 %
HCT: 45.4 % (ref 39.0–52.0)
Hemoglobin: 15.4 g/dL (ref 13.0–17.0)
Immature Granulocytes: 0 %
Lymphocytes Relative: 27 %
Lymphs Abs: 2.5 10*3/uL (ref 0.7–4.0)
MCH: 30.7 pg (ref 26.0–34.0)
MCHC: 33.9 g/dL (ref 30.0–36.0)
MCV: 90.4 fL (ref 80.0–100.0)
Monocytes Absolute: 0.8 10*3/uL (ref 0.1–1.0)
Monocytes Relative: 8 %
Neutro Abs: 5.6 10*3/uL (ref 1.7–7.7)
Neutrophils Relative %: 62 %
Platelets: 206 10*3/uL (ref 150–400)
RBC: 5.02 MIL/uL (ref 4.22–5.81)
RDW: 13.6 % (ref 11.5–15.5)
WBC: 9 10*3/uL (ref 4.0–10.5)
nRBC: 0 % (ref 0.0–0.2)

## 2019-07-14 LAB — ETHANOL: Alcohol, Ethyl (B): 267 mg/dL — ABNORMAL HIGH (ref <10)

## 2019-07-14 LAB — LIPASE, BLOOD: Lipase: 37 U/L (ref 11–51)

## 2019-07-14 MED ORDER — FAMOTIDINE IN NACL 20-0.9 MG/50ML-% IV SOLN
20.0000 mg | Freq: Once | INTRAVENOUS | Status: AC
  Administered 2019-07-14: 03:00:00 20 mg via INTRAVENOUS
  Filled 2019-07-14: qty 50

## 2019-07-14 MED ORDER — SUCRALFATE 1 GM/10ML PO SUSP: 1.0000 g | Freq: Four times a day (QID) | ORAL | 1 refills | Status: AC

## 2019-07-14 MED ORDER — FAMOTIDINE 20 MG PO TABS: 20.0000 mg | ORAL_TABLET | Freq: Two times a day (BID) | ORAL | 0 refills | Status: AC

## 2019-07-14 NOTE — ED Triage Notes (Signed)
Patient ambulatory to triage without difficulty or distress with EMS from home.  Per EMS patient reports problems for approximately 1 year with vomiting and having blood in stool.  EMS vital signs -  Hr 75, BP 137/96, RR 16, pulse oxi 98% on room air.

## 2019-07-14 NOTE — ED Notes (Signed)
Reviewed discharge instructions, follow-up care, and prescriptions with patient. Patient verbalized understanding of all information reviewed. Patient stable, with no distress noted at this time.    

## 2019-07-14 NOTE — Discharge Instructions (Signed)
1.  Drink alcohol in moderation.  This is causing your bleeding issues. 2.  Start Pepcid 20 mg twice daily. 3.  Take Carafate as prescribed. 4.  Return to the ER for worsening symptoms, persistent vomiting, difficulty breathing or other concerns.

## 2019-07-14 NOTE — ED Notes (Signed)
Patient given crackers and peanut butter per MD

## 2019-07-14 NOTE — ED Notes (Signed)
Patient tolerated food with no issue

## 2019-07-14 NOTE — ED Provider Notes (Signed)
[facility] Emergency Department Provider Note   ____________________________________________   First MD Initiated Contact with Patient 07/14/19 [phone]     (approximate)  I have reviewed the triage vital signs and the nursing notes.   HISTORY  Chief Complaint Emesis    HPI Samuel Stuart is a 39 y.o. male who presents to the ED from home via EMS with a chief complaint of rectal bleeding.  Patient reports occasional rectal bleeding over the course of the past year.  This week he also "spit up blood".  Admits to daily alcohol use.  Denies history of DTs.  Occasional use of NSAIDs and aspirin.  Denies fever, cough, chest pain, shortness of breath, abdominal pain, nausea or vomiting.       Past Medical History:  Diagnosis Date  . Heart murmur     There are no problems to display for this patient.   No past surgical history on file.  Prior to Admission medications   Medication Sig Start Date End Date Taking? Authorizing Provider  famotidine (PEPCID) 20 MG tablet Take 1 tablet (20 mg total) by mouth 2 (two) times daily. 07/14/19   Irean Hong, MD  sucralfate (CARAFATE) 1 GM/10ML suspension Take 10 mLs (1 g total) by mouth 4 (four) times daily. 07/14/19   Irean Hong, MD    Allergies Patient has no known allergies.  No family history on file.  Social History Social History   Tobacco Use  . Smoking status: Current Every Day Smoker    Packs/day: 0.50    Types: Cigarettes  . Smokeless tobacco: Never Used  Substance Use Topics  . Alcohol use: Yes  . Drug use: Yes    Types: Marijuana    Comment: last smoked about 1 hour ago    Review of Systems  Constitutional: No fever/chills Eyes: No visual changes. ENT: No sore throat. Cardiovascular: Denies chest pain. Respiratory: Denies shortness of breath. Gastrointestinal: Positive for rectal bleeding.  No abdominal pain.  No nausea, no vomiting.  No diarrhea.  No constipation. Genitourinary:  Negative for dysuria. Musculoskeletal: Negative for back pain. Skin: Negative for rash. Neurological: Negative for headaches, focal weakness or numbness.   ____________________________________________   PHYSICAL EXAM:  VITAL SIGNS: ED Triage Vitals [07/14/19 0145]  Enc Vitals Group     BP 127/80     Pulse Rate 96     Resp 16     Temp 97.8 F (36.6 C)     Temp Source Oral     SpO2 96 %     Weight 150 lb (68 kg)     Height 5\' 7"  (1.702 m)     Head Circumference      Peak Flow      Pain Score 0     Pain Loc      Pain Edu?      Excl. in GC?     Constitutional: Alert and oriented. Well appearing and in no acute distress. Eyes: Conjunctivae are normal. PERRL. EOMI. Head: Atraumatic. Nose: No congestion/rhinnorhea. Mouth/Throat: Mucous membranes are moist.  Oropharynx non-erythematous. Neck: No stridor.   Cardiovascular: Normal rate, regular rhythm. Grossly normal heart sounds.  Good peripheral circulation. Respiratory: Normal respiratory effort.  No retractions. Lungs CTAB. Gastrointestinal: Soft and nontender to light or deep palpation.  No distention. No abdominal bruits. No CVA tenderness. Musculoskeletal: No lower extremity tenderness nor edema.  No joint effusions. Neurologic:  Normal speech and language. No gross focal neurologic deficits are appreciated. No  gait instability. Skin:  Skin is warm, dry and intact. No rash noted. Psychiatric: Mood and affect are normal. Speech and behavior are normal.  ____________________________________________   LABS (all labs ordered are listed, but only abnormal results are displayed)  Labs Reviewed  ETHANOL - Abnormal; Notable for the following components:      Result Value   Alcohol, Ethyl (B) 267 (*)    All other components within normal limits  CBC WITH DIFFERENTIAL/PLATELET  COMPREHENSIVE METABOLIC PANEL  LIPASE, BLOOD    ____________________________________________  EKG  None ____________________________________________  RADIOLOGY  ED MD interpretation: None  Official radiology report(s): No results found.  ____________________________________________   PROCEDURES  Procedure(s) performed (including Critical Care):  Procedures  Rectal exam: External exam with small, reducible, nonthrombosed, nonbleeding hemorrhoid.  Tan stool on gloved finger which is faintly heme positive. ____________________________________________   INITIAL IMPRESSION / ASSESSMENT AND PLAN / ED COURSE  As part of my medical decision making, I reviewed the following data within the [place] notes reviewed and incorporated, Labs reviewed, Old chart reviewed and Notes from prior ED visits     Samuel Stuart was evaluated in Emergency Department on 07/15/2019 for the symptoms described in the history of present illness. He was evaluated in the context of the global COVID-19 pandemic, which necessitated consideration that the patient might be at risk for infection with the SARS-CoV-2 virus that causes COVID-19. Institutional protocols and algorithms that pertain to the evaluation of patients at risk for COVID-19 are in a state of rapid change based on information released by regulatory bodies including the CDC and federal and state organizations. These policies and algorithms were followed during the patient's care in the ED.    39 year old daily alcohol drinker presenting with occasional rectal bleeding and "spitting up blood" for the past year. Differential diagnosis includes, but is not limited to, biliary disease (biliary colic, acute cholecystitis, cholangitis, choledocholithiasis, etc), intrathoracic causes for epigastric abdominal pain including ACS, gastritis, duodenitis, pancreatitis, small bowel or large bowel obstruction, abdominal aortic aneurysm, hernia, and ulcer(s).  Will obtain lab work  to include LFTs and lipase, administer IV Pepcid for symptomatic relief of symptoms and reassess.   Clinical Course as of Jul 15 307  Sat Jul 14, 2019  6301 Updated patient on all test results.  He is alert and oriented, clinically sober and ambulatory with steady gait.  Will have something to eat prior to discharge.  Will place on daily Pepcid and Carafate.  Have advised him to curb his alcohol and NSAID use.  Strict return precautions given.  Patient verbalizes understanding agrees with plan of care.   [JS]    Clinical Course User Index [JS] Paulette Blanch, MD     ____________________________________________   FINAL CLINICAL IMPRESSION(S) / ED DIAGNOSES  Final diagnoses:  Acute gastritis, presence of bleeding unspecified, unspecified gastritis type  Alcoholic intoxication without complication [facility])     ED Discharge Orders         Ordered    sucralfate (CARAFATE) 1 GM/10ML suspension  4 times daily     07/14/19 0420    famotidine (PEPCID) 20 MG tablet  2 times daily     07/14/19 6010           Note:  This document was prepared using Dragon voice recognition software and may include unintentional dictation errors.   Paulette Blanch, MD 07/15/19 [phone]

## 2020-05-08 ENCOUNTER — Emergency Department
Admission: EM | Admit: 2020-05-08 | Discharge: 2020-05-08 | Disposition: A | Discharge status: Home / Self Care | Payer: Self-pay | Attending: Emergency Medicine | Admitting: Emergency Medicine

## 2020-05-08 ENCOUNTER — Other Ambulatory Visit: Payer: Self-pay

## 2020-05-08 ENCOUNTER — Encounter: Payer: Self-pay | Admitting: Emergency Medicine

## 2020-05-08 DIAGNOSIS — M5431 Sciatica, right side: Secondary | ICD-10-CM | POA: Insufficient documentation

## 2020-05-08 DIAGNOSIS — F17210 Nicotine dependence, cigarettes, uncomplicated: Secondary | ICD-10-CM | POA: Insufficient documentation

## 2020-05-08 MED ORDER — ORPHENADRINE CITRATE 30 MG/ML IJ SOLN
60.0000 mg | Freq: Once | INTRAMUSCULAR | Status: AC
  Administered 2020-05-08: 60 mg via INTRAMUSCULAR
  Filled 2020-05-08: qty 2

## 2020-05-08 MED ORDER — METHOCARBAMOL 500 MG PO TABS: 500.0000 mg | ORAL_TABLET | Freq: Four times a day (QID) | ORAL | 0 refills | Status: AC

## 2020-05-08 MED ORDER — MELOXICAM 15 MG PO TABS: 15.0000 mg | ORAL_TABLET | Freq: Every day | ORAL | 0 refills | Status: AC

## 2020-05-08 MED ORDER — KETOROLAC TROMETHAMINE 30 MG/ML IJ SOLN
30.0000 mg | Freq: Once | INTRAMUSCULAR | Status: AC
  Administered 2020-05-08: 30 mg via INTRAMUSCULAR
  Filled 2020-05-08: qty 1

## 2020-05-08 NOTE — ED Notes (Signed)
See triage note, pt reports pain to right lower back that radiated down leg to foot x 1 month. Pt ambulatory to treatment room.  States he's "just got tired of it" Reports taking taking 800 ibuprofen today with minimal relief

## 2020-05-08 NOTE — ED Provider Notes (Signed)
[facility] Emergency Department Provider Note  ____________________________________________  Time seen: Approximately 9:46 PM  I have reviewed the triage vital signs and the nursing notes.   HISTORY  Chief Complaint Back Pain and Sciatica    HPI Samuel Stuart is a 39 y.o. male who presents the emergency department complaining of a month to month and a half of low back pain with radiation down the right leg.  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  He states that he has to lift a 50 pound bar to move it to another station.  Patient states that he is having pain, tightness to the right side running through his buttocks and down his right leg.  No direct injury.  No bowel or bladder dysfunction, saddle anesthesia or paresthesias.  No urinary or GI complaints.  Patient states that he has been taking ibuprofen without relief.         Past Medical History:  Diagnosis Date  . Heart murmur     There are no problems to display for this patient.   History reviewed. No pertinent surgical history.  Prior to Admission medications   Medication Sig Start Date End Date Taking? Authorizing Provider  famotidine (PEPCID) 20 MG tablet Take 1 tablet (20 mg total) by mouth 2 (two) times daily. 07/14/19   Irean Hong, MD  meloxicam (MOBIC) 15 MG tablet Take 1 tablet (15 mg total) by mouth daily. 05/08/20   Shylo Dillenbeck, Delorise Royals, PA-C  methocarbamol (ROBAXIN) 500 MG tablet Take 1 tablet (500 mg total) by mouth 4 (four) times daily. 05/08/20   Lariza Cothron, Delorise Royals, PA-C  sucralfate (CARAFATE) 1 GM/10ML suspension Take 10 mLs (1 g total) by mouth 4 (four) times daily. 07/14/19   Irean Hong, MD    Allergies Patient has no known allergies.  History reviewed. No pertinent family history.  Social History Social History   Tobacco Use  . Smoking status: Current Every Day Smoker    Packs/day: 0.50    Types: Cigarettes  . Smokeless tobacco: Never  Used  Substance Use Topics  . Alcohol use: Yes  . Drug use: Yes    Types: Marijuana    Comment: last smoked about 1 hour ago     Review of Systems  Constitutional: No fever/chills Eyes: No visual changes. No discharge ENT: No upper respiratory complaints. Cardiovascular: no chest pain. Respiratory: no cough. No SOB. Gastrointestinal: No abdominal pain.  No nausea, no vomiting.  No diarrhea.  No constipation. Genitourinary: Negative for dysuria. No hematuria Musculoskeletal: 1 to 1.63-month history of right lower back pain radiating down the right leg Skin: Negative for rash, abrasions, lacerations, ecchymosis. Neurological: Negative for headaches, focal weakness or numbness.  10 System ROS otherwise negative.  ____________________________________________   PHYSICAL EXAM:  VITAL SIGNS: ED Triage Vitals [05/08/20 2022]  Enc Vitals Group     BP (!) 149/85     Pulse Rate 96     Resp 18     Temp 99.3 F (37.4 C)     Temp Source Oral     SpO2 96 %     Weight 160 lb (72.6 kg)     Height 5\' 6"  (1.676 m)     Head Circumference      Peak Flow      Pain Score 9     Pain Loc      Pain Edu?      Excl. in GC?  Constitutional: Alert and oriented. Well appearing and in no acute distress. Eyes: Conjunctivae are normal. PERRL. EOMI. Head: Atraumatic. ENT:      Ears:       Nose: No congestion/rhinnorhea.      Mouth/Throat: Mucous membranes are moist.  Neck: No stridor.    Cardiovascular: Normal rate, regular rhythm. Normal S1 and S2.  Good peripheral circulation. Respiratory: Normal respiratory effort without tachypnea or retractions. Lungs CTAB. Good air entry to the bases with no decreased or absent breath sounds. Gastrointestinal: Bowel sounds 4 quadrants. Soft and nontender to palpation. No guarding or rigidity. No palpable masses. No distention. No CVA tenderness. Musculoskeletal: Full range of motion to all extremities. No gross deformities appreciated.   Visualization of the spine revealed no visible signs of trauma.  No ecchymosis or edema.  No lacerations or abrasions.  Patient was nontender to palpation midline over the spinal processes.  Patient was tender to palpation over the right paraspinal muscle group with appreciable spasm.  This extends into the sciatic notch.  Negative straight leg raise bilaterally.  Dorsalis pedis pulses sensation intact and equal bilateral lower extremities. Neurologic:  Normal speech and language. No gross focal neurologic deficits are appreciated.  Skin:  Skin is warm, dry and intact. No rash noted. Psychiatric: Mood and affect are normal. Speech and behavior are normal. Patient exhibits appropriate insight and judgement.   ____________________________________________   LABS (all labs ordered are listed, but only abnormal results are displayed)  Labs Reviewed - No data to display ____________________________________________  EKG   ____________________________________________  RADIOLOGY   No results found.  ____________________________________________    PROCEDURES  Procedure(s) performed:    Procedures    Medications  ketorolac (TORADOL) 30 MG/ML injection 30 mg (has no administration in time range)  orphenadrine (NORFLEX) injection 60 mg (has no administration in time range)     ____________________________________________   INITIAL IMPRESSION / ASSESSMENT AND PLAN / ED COURSE  Pertinent labs & imaging results that were available during my care of the patient were reviewed by me and considered in my medical decision making (see chart for details).  Review of the [place] CSRS was performed in accordance of the [facility] prior to dispensing any controlled drugs.           Patient's diagnosis is consistent with sciatica.  Patient presented to emergency department complaining of right lower back pain rating down his right leg.  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  No direct  trauma.  No urinary or GI complaints.  Patient was neurologically intact.  No evidence of trauma on physical exam.  At this time findings were consistent with sciatica.  Patient will be given Toradol and Norflex here in the emergency department.  I will prescribe meloxicam and Robaxin and have patient follow-up with orthopedics for further work restrictions.  No indications for labs or imaging as this has been slowly progressing over the past 1/2 months without direct trauma.  No concerning neuro deficit..Patient is given ED precautions to return to the ED for any worsening or new symptoms.     ____________________________________________  FINAL CLINICAL IMPRESSION(S) / ED DIAGNOSES  Final diagnoses:  Sciatica of right side      NEW MEDICATIONS STARTED DURING THIS VISIT:  ED Discharge Orders         Ordered    meloxicam (MOBIC) 15 MG tablet  Daily        05/08/20 2146    methocarbamol (ROBAXIN) 500 MG tablet  4 times daily        05/08/20 2146              This chart was dictated using voice recognition software/Dragon. Despite best efforts to proofread, errors can occur which can change the meaning. Any change was purely unintentional.    Racheal Patches, PA-C 05/08/20 2149    Sharyn Creamer, MD 05/08/20 [phone]

## 2020-05-08 NOTE — ED Triage Notes (Signed)
Pt to ED via EMS from home c/o lower back pain radiating down right leg x1 month.  States worse after working, denies known injury.  Pt ambulatory to triage, A&Ox4, in NAD at this time.

## 2020-05-08 NOTE — ED Notes (Signed)
Pt asking about wait time to see a provider, stating "this shit ridiculous, I'm in pain" This RN apologized for wait and explained a provider will see him as soon as able.

## 2020-06-02 ENCOUNTER — Other Ambulatory Visit: Payer: Self-pay

## 2020-06-02 DIAGNOSIS — Z5321 Procedure and treatment not carried out due to patient leaving prior to being seen by health care provider: Secondary | ICD-10-CM | POA: Insufficient documentation

## 2020-06-02 DIAGNOSIS — M79604 Pain in right leg: Secondary | ICD-10-CM | POA: Insufficient documentation

## 2020-06-02 DIAGNOSIS — M5430 Sciatica, unspecified side: Secondary | ICD-10-CM | POA: Insufficient documentation

## 2020-06-02 NOTE — ED Triage Notes (Signed)
Pt in with co right lower back pain radiating into right leg. Pt has been dx with sciatica in the past.

## 2020-06-03 ENCOUNTER — Emergency Department
Admission: EM | Admit: 2020-06-03 | Discharge: 2020-06-03 | Disposition: A | Discharge status: Home / Self Care | Payer: Self-pay | Attending: Emergency Medicine | Admitting: Emergency Medicine

## 2020-06-24 ENCOUNTER — Other Ambulatory Visit: Payer: Self-pay | Admitting: Physician Assistant

## 2020-06-24 DIAGNOSIS — M5441 Lumbago with sciatica, right side: Secondary | ICD-10-CM

## 2020-07-04 ENCOUNTER — Ambulatory Visit
Admission: RE | Admit: 2020-07-04 | Discharge: 2020-07-04 | Disposition: A | Discharge status: Home / Self Care | Payer: BC Managed Care – PPO | Source: Ambulatory Visit | Attending: Physician Assistant | Admitting: Physician Assistant

## 2020-07-04 ENCOUNTER — Other Ambulatory Visit: Payer: Self-pay

## 2020-07-04 DIAGNOSIS — M5441 Lumbago with sciatica, right side: Secondary | ICD-10-CM | POA: Diagnosis not present

## 2021-04-05 ENCOUNTER — Emergency Department
Admission: EM | Admit: 2021-04-05 | Discharge: 2021-04-05 | Disposition: A | Discharge status: Home / Self Care | Payer: BC Managed Care – PPO | Attending: Emergency Medicine | Admitting: Emergency Medicine

## 2021-04-05 ENCOUNTER — Other Ambulatory Visit: Payer: Self-pay

## 2021-04-05 ENCOUNTER — Encounter: Payer: Self-pay | Admitting: Emergency Medicine

## 2021-04-05 DIAGNOSIS — F1010 Alcohol abuse, uncomplicated: Secondary | ICD-10-CM | POA: Insufficient documentation

## 2021-04-05 DIAGNOSIS — Z5321 Procedure and treatment not carried out due to patient leaving prior to being seen by health care provider: Secondary | ICD-10-CM | POA: Insufficient documentation

## 2021-04-05 DIAGNOSIS — R451 Restlessness and agitation: Secondary | ICD-10-CM | POA: Insufficient documentation

## 2021-04-05 NOTE — ED Provider Notes (Signed)
Emergency Medicine Provider Triage Evaluation Note  Samuel Stuart , a 40 y.o. male  was evaluated in triage.  Pt complains of requesting detox from alcohol. Patient beligerent and demanding to be brought back to a room now. Patient thought several times that he needs to wait because the department is very full and there is no bed for him at this time.  Explained to the patient the process of triage and the need to wait until beds available.  Patient continues to pace, yelling, demanding to be brought back, threatening staff and security, and threatening patients in the waiting room.  Patient denies any suicidal homicidal thoughts.  Review of Systems  Positive: + agitation Negative: Nausea, vomiting, tremor,   Physical Exam  BP (!) 144/92 (BP Location: Right Arm)   Pulse 100   Temp 98.3 F (36.8 C) (Oral)   Resp 18   Ht 5\' 5"  (1.651 m)   Wt 63.5 kg   SpO2 96%   BMI 23.30 kg/m  Gen:   Awake, no distress , ambulating with no difficulty, clinically sober Resp:  Normal effort  MSK:   Moves extremities without difficulty  Other:  No tremors  Medical Decision Making  Medically screening exam initiated at 1:46 AM.  Appropriate orders placed.  Isabel Freese was informed that the remainder of the evaluation will be completed by another provider, this initial triage assessment does not replace that evaluation, and the importance of remaining in the ED until their evaluation is complete.  67M history of alcohol abuse here requesting detox.  Patient is voluntary no indication for IVC.  Patient is belligerent, yelling and threatening patients in the waiting room, security and staff and demanding to be brought back to her room.  Patient says "I do not care there are other people in front of me, I need a room right now."  I have tried several times to reason with patient and ask him to please have a seat, stop threatening patients and staff and wait for his turn to be seen but patient refuses to be  cooperative.  He is clinically sober with normal gait, no slurred speech, intact reasoning process.  I explained to the patient that if he is unable to wait that he is welcome to leave but we are happy to see him and take care of him and help him with anything that he needs when it is his turn to be taken back otherwise patient will be asked to leave the premises of the hospital. Patient does not meet IVC criteria, he is clinically sober.    Thea Silversmith, MD 04/05/21 [phone]

## 2021-04-05 NOTE — ED Triage Notes (Signed)
[employer] arrived, pt requesting to speak with [employer] outside and away from hospital staff. Pt can now be heard screaming loudly at [employer] officers and seen waving arms at [employer] officers.

## 2021-04-05 NOTE — ED Triage Notes (Signed)
EDP Don Perking to lobby to speak with patient regarding patient behaviors.

## 2021-04-05 NOTE — ED Triage Notes (Signed)
Pt arrived voluntarily with [employer] officers states he is here to stop drinking. Pt reports drinking daily when asked if how much he states "I don't know" pt states he wants to be normal again.  Pt did not answer when asked how much he drank today and repeatedly asking for help.

## 2021-04-05 NOTE — ED Notes (Signed)
Pt continues to have inflammatory and verbally aggressive behavior with staff, yelling across the lobby at staff. AC in lobby at this time and aware of patient behaviors and numerous requests for patient to stop engaging in aggressive behaviors. Per [facility] pt will be trespassed at this time. MSE done by Dr. Don Perking who is aware that patient is going to be trespassed.

## 2021-04-05 NOTE — ED Triage Notes (Signed)
Pt repeatedly to desk and yelling at staff about "I need my help". Pt repeatedly interfering in care of other patients despite staff's repeated attempts to redirect patient. Charge RN aware of patient behaviors. Pt visualized walking around lobby with steady gait at this time, pt speaking in full and complete sentences without difficulty at this time, pt A&O x4. Pt visualized in NAD as he asks staff about wait times for himself and for other patients and asks for lighter. Pt repeatedly showing aggressive behavior with staff and security.

## 2023-05-25 ENCOUNTER — Emergency Department
Admission: EM | Admit: 2023-05-25 | Discharge: 2023-05-25 | Discharge status: Against Medical Advice | Payer: Medicaid Other | Attending: Emergency Medicine | Admitting: Emergency Medicine

## 2023-05-25 ENCOUNTER — Encounter: Payer: Self-pay | Admitting: Emergency Medicine

## 2023-05-25 ENCOUNTER — Emergency Department: Payer: Medicaid Other

## 2023-05-25 DIAGNOSIS — S0181XA Laceration without foreign body of other part of head, initial encounter: Secondary | ICD-10-CM | POA: Diagnosis not present

## 2023-05-25 DIAGNOSIS — Z5321 Procedure and treatment not carried out due to patient leaving prior to being seen by health care provider: Secondary | ICD-10-CM | POA: Insufficient documentation

## 2023-05-25 DIAGNOSIS — R4689 Other symptoms and signs involving appearance and behavior: Secondary | ICD-10-CM | POA: Diagnosis present

## 2023-05-25 DIAGNOSIS — X58XXXA Exposure to other specified factors, initial encounter: Secondary | ICD-10-CM | POA: Insufficient documentation

## 2023-05-25 LAB — CBC
HCT: 44.3 % (ref 39.0–52.0)
Hemoglobin: 14.9 g/dL (ref 13.0–17.0)
MCH: 29.7 pg (ref 26.0–34.0)
MCHC: 33.6 g/dL (ref 30.0–36.0)
MCV: 88.2 fL (ref 80.0–100.0)
Platelets: 259 10*3/uL (ref 150–400)
RBC: 5.02 MIL/uL (ref 4.22–5.81)
RDW: 14.7 % (ref 11.5–15.5)
WBC: 7.7 10*3/uL (ref 4.0–10.5)
nRBC: 0 % (ref 0.0–0.2)

## 2023-05-25 LAB — BASIC METABOLIC PANEL
Anion gap: 9 (ref 5–15)
BUN: 9 mg/dL (ref 6–20)
CO2: 26 mmol/L (ref 22–32)
Calcium: 8.9 mg/dL (ref 8.9–10.3)
Chloride: 104 mmol/L (ref 98–111)
Creatinine, Ser: 0.76 mg/dL (ref 0.61–1.24)
GFR, Estimated: >60 mL/min (ref >60)
Glucose, Bld: 95 mg/dL (ref 70–99)
Potassium: 4.3 mmol/L (ref 3.5–5.1)
Sodium: 139 mmol/L (ref 135–145)

## 2023-05-25 LAB — TROPONIN I (HIGH SENSITIVITY): Troponin I (High Sensitivity): 4 ng/L (ref <18)

## 2023-05-25 NOTE — ED Notes (Signed)
Patient approached NF desk at this time stating he no longer wanted to be seen and that he was going to see a family member upstairs.

## 2023-05-25 NOTE — ED Triage Notes (Signed)
Pt waiting to be registered yelling at this RN and staff stating nobody is fucking helping me and pt repeating what if I'm dying. Security called.
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
## 2023-08-04 ENCOUNTER — Other Ambulatory Visit: Payer: Self-pay

## 2023-08-04 ENCOUNTER — Emergency Department
Admission: EM | Admit: 2023-08-04 | Discharge: 2023-08-04 | Disposition: A | Discharge status: Home / Self Care | Payer: Medicaid Other | Attending: Emergency Medicine | Admitting: Emergency Medicine

## 2023-08-04 ENCOUNTER — Emergency Department: Payer: Medicaid Other

## 2023-08-04 DIAGNOSIS — W19XXXA Unspecified fall, initial encounter: Secondary | ICD-10-CM | POA: Insufficient documentation

## 2023-08-04 DIAGNOSIS — R519 Headache, unspecified: Secondary | ICD-10-CM | POA: Diagnosis present

## 2023-08-04 MED ORDER — BUTALBITAL-APAP-CAFFEINE 50-325-40 MG PO TABS: 1.0000 | ORAL_TABLET | Freq: Four times a day (QID) | ORAL | 0 refills | Status: AC | PRN

## 2023-08-04 MED ORDER — BUTALBITAL-APAP-CAFFEINE 50-325-40 MG PO TABS
2.0000 | ORAL_TABLET | Freq: Once | ORAL | Status: AC
  Administered 2023-08-04: 2 via ORAL
  Filled 2023-08-04: qty 2

## 2023-08-04 NOTE — ED Triage Notes (Signed)
 EMS brings pt in from home for c/o rt sided HA
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
## 2023-08-04 NOTE — ED Triage Notes (Signed)
 [facility] for headache  Per pt "two months ago I fell and hit by head, I left before I was seen bc the wait was so long. It has been hurting since, but the pain is getting worse it is now making my right eye and temple hurt also."
